# Patient Record
Sex: Male | Born: 1980 | Race: White | Hispanic: No | Marital: Married | Smoking: Current every day smoker
Health system: Southern US, Community
[De-identification: ages and names within clinical notes are randomized; demographics above are authoritative.]

## PROBLEM LIST (undated history)

## (undated) DIAGNOSIS — T4145XA Adverse effect of unspecified anesthetic, initial encounter: Secondary | ICD-10-CM

## (undated) DIAGNOSIS — G4720 Circadian rhythm sleep disorder, unspecified type: Secondary | ICD-10-CM

## (undated) DIAGNOSIS — M25579 Pain in unspecified ankle and joints of unspecified foot: Secondary | ICD-10-CM

## (undated) DIAGNOSIS — R4189 Other symptoms and signs involving cognitive functions and awareness: Secondary | ICD-10-CM

## (undated) DIAGNOSIS — K589 Irritable bowel syndrome without diarrhea: Secondary | ICD-10-CM

## (undated) DIAGNOSIS — F329 Major depressive disorder, single episode, unspecified: Secondary | ICD-10-CM

## (undated) DIAGNOSIS — F419 Anxiety disorder, unspecified: Secondary | ICD-10-CM

## (undated) DIAGNOSIS — G8929 Other chronic pain: Secondary | ICD-10-CM

## (undated) DIAGNOSIS — M25569 Pain in unspecified knee: Secondary | ICD-10-CM

## (undated) DIAGNOSIS — R200 Anesthesia of skin: Secondary | ICD-10-CM

## (undated) DIAGNOSIS — L309 Dermatitis, unspecified: Secondary | ICD-10-CM

## (undated) DIAGNOSIS — M549 Dorsalgia, unspecified: Secondary | ICD-10-CM

## (undated) DIAGNOSIS — T8859XA Other complications of anesthesia, initial encounter: Secondary | ICD-10-CM

## (undated) DIAGNOSIS — G939 Disorder of brain, unspecified: Secondary | ICD-10-CM

## (undated) DIAGNOSIS — R519 Headache, unspecified: Secondary | ICD-10-CM

## (undated) DIAGNOSIS — R51 Headache: Secondary | ICD-10-CM

## (undated) DIAGNOSIS — M1990 Unspecified osteoarthritis, unspecified site: Secondary | ICD-10-CM

## (undated) DIAGNOSIS — K529 Noninfective gastroenteritis and colitis, unspecified: Secondary | ICD-10-CM

## (undated) DIAGNOSIS — R202 Paresthesia of skin: Secondary | ICD-10-CM

## (undated) DIAGNOSIS — S069X0A Unspecified intracranial injury without loss of consciousness, initial encounter: Secondary | ICD-10-CM

## (undated) DIAGNOSIS — F32A Depression, unspecified: Secondary | ICD-10-CM

## (undated) DIAGNOSIS — G9389 Other specified disorders of brain: Secondary | ICD-10-CM

## (undated) HISTORY — PX: NASAL FRACTURE SURGERY: SHX718

## (undated) HISTORY — PX: EYE SURGERY: SHX253

## (undated) HISTORY — PX: COLONOSCOPY W/ BIOPSIES AND POLYPECTOMY: SHX1376

## (undated) HISTORY — PX: WISDOM TOOTH EXTRACTION: SHX21

## (undated) HISTORY — PX: FECAL TRANSPLANT: SHX6383

---

## 2014-02-14 ENCOUNTER — Other Ambulatory Visit: Payer: Self-pay | Admitting: Family Medicine

## 2014-02-14 DIAGNOSIS — M5412 Radiculopathy, cervical region: Secondary | ICD-10-CM

## 2014-02-19 ENCOUNTER — Ambulatory Visit
Admission: RE | Admit: 2014-02-19 | Discharge: 2014-02-19 | Disposition: A | Discharge status: Home / Self Care | Source: Ambulatory Visit | Attending: Family Medicine | Admitting: Family Medicine

## 2014-02-19 DIAGNOSIS — M5412 Radiculopathy, cervical region: Secondary | ICD-10-CM

## 2014-03-14 ENCOUNTER — Other Ambulatory Visit: Payer: Self-pay | Admitting: Neurosurgery

## 2014-03-21 ENCOUNTER — Encounter (HOSPITAL_COMMUNITY)
Admission: RE | Admit: 2014-03-21 | Discharge: 2014-03-21 | Disposition: A | Discharge status: Home / Self Care | Source: Ambulatory Visit | Attending: Neurosurgery | Admitting: Neurosurgery

## 2014-03-21 ENCOUNTER — Encounter (HOSPITAL_COMMUNITY): Payer: Self-pay

## 2014-03-21 HISTORY — DX: Anesthesia of skin: R20.0

## 2014-03-21 HISTORY — DX: Other specified disorders of brain: G93.89

## 2014-03-21 HISTORY — DX: Major depressive disorder, single episode, unspecified: F32.9

## 2014-03-21 HISTORY — DX: Paresthesia of skin: R20.2

## 2014-03-21 HISTORY — DX: Pain in unspecified knee: M25.569

## 2014-03-21 HISTORY — DX: Headache, unspecified: R51.9

## 2014-03-21 HISTORY — DX: Pain in unspecified ankle and joints of unspecified foot: M25.579

## 2014-03-21 HISTORY — DX: Other chronic pain: G89.29

## 2014-03-21 HISTORY — DX: Other complications of anesthesia, initial encounter: T88.59XA

## 2014-03-21 HISTORY — DX: Circadian rhythm sleep disorder, unspecified type: G47.20

## 2014-03-21 HISTORY — DX: Unspecified intracranial injury without loss of consciousness, initial encounter: S06.9X0A

## 2014-03-21 HISTORY — DX: Disorder of brain, unspecified: G93.9

## 2014-03-21 HISTORY — DX: Anxiety disorder, unspecified: F41.9

## 2014-03-21 HISTORY — DX: Depression, unspecified: F32.A

## 2014-03-21 HISTORY — DX: Adverse effect of unspecified anesthetic, initial encounter: T41.45XA

## 2014-03-21 HISTORY — DX: Dermatitis, unspecified: L30.9

## 2014-03-21 HISTORY — DX: Noninfective gastroenteritis and colitis, unspecified: K52.9

## 2014-03-21 HISTORY — DX: Headache: R51

## 2014-03-21 HISTORY — DX: Other symptoms and signs involving cognitive functions and awareness: R41.89

## 2014-03-21 HISTORY — DX: Unspecified osteoarthritis, unspecified site: M19.90

## 2014-03-21 HISTORY — DX: Dorsalgia, unspecified: M54.9

## 2014-03-21 HISTORY — DX: Irritable bowel syndrome without diarrhea: K58.9

## 2014-03-21 LAB — BASIC METABOLIC PANEL
ANION GAP: 9 (ref 5–15)
BUN: 12 mg/dL (ref 6–23)
CO2: 26 mmol/L (ref 19–32)
Calcium: 9.4 mg/dL (ref 8.4–10.5)
Chloride: 105 mmol/L (ref 96–112)
Creatinine, Ser: 0.91 mg/dL (ref 0.50–1.35)
GFR calc Af Amer: >90 mL/min (ref >90)
GFR calc non Af Amer: >90 mL/min (ref >90)
GLUCOSE: 91 mg/dL (ref 70–99)
POTASSIUM: 3.7 mmol/L (ref 3.5–5.1)
SODIUM: 140 mmol/L (ref 135–145)

## 2014-03-21 LAB — CBC
HCT: 45.2 % (ref 39.0–52.0)
HEMOGLOBIN: 16.2 g/dL (ref 13.0–17.0)
MCH: 32.2 pg (ref 26.0–34.0)
MCHC: 35.8 g/dL (ref 30.0–36.0)
MCV: 89.9 fL (ref 78.0–100.0)
Platelets: 189 10*3/uL (ref 150–400)
RBC: 5.03 MIL/uL (ref 4.22–5.81)
RDW: 12.8 % (ref 11.5–15.5)
WBC: 9.3 10*3/uL (ref 4.0–10.5)

## 2014-03-21 LAB — SURGICAL PCR SCREEN
MRSA, PCR: NEGATIVE
Staphylococcus aureus: NEGATIVE

## 2014-03-21 NOTE — Pre-Procedure Instructions (Signed)
Jacob Fisher  03/21/2014   Your procedure is scheduled on: Thursday, March 22, 2014  [facility] Admitting at 12:00 PM.  Call this number if you have problems the morning of surgery: [phone]   Remember:   Do not eat food or drink liquids after midnight.   Take these medicines the morning of surgery with A SIP OF WATER: escitalopram (LEXAPRO), gabapentin (NEURONTIN), if needed:acetaminophen (TYLENOL) for pain, busPIRone (BUSPAR) for anxiety, EPINEPHrine 0.3 mg/0.3 mL IJ SOAJ injection  Stop taking Aspirin, vitamins, and herbal medications (Melatonin) Do not take any NSAIDs ie: Ibuprofen, Advil, Naproxen or any medication containing Aspirin.   Do not wear jewelry, make-up or nail polish.  Do not wear lotions, powders, or perfumes. You may not wear deodorant.  Do not shave 48 hours prior to surgery. Men may shave face and neck.  Do not bring valuables to the hospital.  [facility] is not responsible for any belongings or valuables.               Contacts, dentures or bridgework may not be worn into surgery.  Leave suitcase in the car. After surgery it may be brought to your room.  For patients admitted to the hospital, discharge time is determined by your treatment team.               Patients discharged the day of surgery will not be allowed to drive home.  Name and phone number of your driver:   Special Instructions:  Special Instructions:Special Instructions: [facility] - Pre[facility]  Before surgery, you can play an important role.  Because skin is not sterile, your skin needs to be as free of germs as possible.  You can reduce the number of germs on you skin by washing with CHG (chlorahexidine gluconate) soap before surgery.  CHG is an antiseptic cleaner which kills germs and bonds with the skin to continue killing germs even after washing.  Please DO NOT use if you have an allergy to CHG or antibacterial soaps.  If your skin becomes  reddened/irritated stop using the CHG and inform your nurse when you arrive at Short Stay.  Do not shave (including legs and underarms) for at least 48 hours prior to the first CHG shower.  You may shave your face.  Please follow these instructions carefully:   1.  Shower with CHG Soap the night before surgery and the morning of Surgery.  2.  If you choose to wash your hair, wash your hair first as usual with your normal shampoo.  3.  After you shampoo, rinse your hair and body thoroughly to remove the Shampoo.  4.  Use CHG as you would any other liquid soap.  You can apply chg directly  to the skin and wash gently with scrungie or a clean washcloth.  5.  Apply the CHG Soap to your body ONLY FROM THE NECK DOWN.  Do not use on open wounds or open sores.  Avoid contact with your eyes, ears, mouth and genitals (private parts).  Wash genitals (private parts) with your normal soap.  6.  Wash thoroughly, paying special attention to the area where your surgery will be performed.  7.  Thoroughly rinse your body with warm water from the neck down.  8.  DO NOT shower/wash with your normal soap after using and rinsing off the CHG Soap.  9.  Pat yourself dry with a clean towel.  10.  Wear clean pajamas.            11.  Place clean sheets on your bed the night of your first shower and do not sleep with pets.  Day of Surgery  Do not apply any lotions/deodorants the morning of surgery.  Please wear clean clothes to the hospital/surgery center.   Please read over the following fact sheets that you were given: Pain Booklet, Coughing and Deep Breathing, MRSA Information and Surgical Site Infection Prevention

## 2014-03-22 ENCOUNTER — Inpatient Hospital Stay (HOSPITAL_COMMUNITY): Admitting: Anesthesiology

## 2014-03-22 ENCOUNTER — Inpatient Hospital Stay (HOSPITAL_COMMUNITY)
Admission: RE | Admit: 2014-03-22 | Discharge: 2014-03-23 | DRG: 473 | Disposition: A | Discharge status: Home / Self Care | Source: Ambulatory Visit | Attending: Neurosurgery | Admitting: Neurosurgery

## 2014-03-22 ENCOUNTER — Inpatient Hospital Stay (HOSPITAL_COMMUNITY)

## 2014-03-22 ENCOUNTER — Encounter (HOSPITAL_COMMUNITY)
Admission: RE | Disposition: A | Discharge status: Home / Self Care | Payer: Self-pay | Source: Ambulatory Visit | Attending: Neurosurgery

## 2014-03-22 ENCOUNTER — Encounter (HOSPITAL_COMMUNITY): Payer: Self-pay | Admitting: *Deleted

## 2014-03-22 DIAGNOSIS — F17210 Nicotine dependence, cigarettes, uncomplicated: Secondary | ICD-10-CM | POA: Diagnosis present

## 2014-03-22 DIAGNOSIS — Z01812 Encounter for preprocedural laboratory examination: Secondary | ICD-10-CM

## 2014-03-22 DIAGNOSIS — F329 Major depressive disorder, single episode, unspecified: Secondary | ICD-10-CM | POA: Diagnosis present

## 2014-03-22 DIAGNOSIS — M5412 Radiculopathy, cervical region: Secondary | ICD-10-CM | POA: Diagnosis present

## 2014-03-22 DIAGNOSIS — Z87820 Personal history of traumatic brain injury: Secondary | ICD-10-CM | POA: Diagnosis not present

## 2014-03-22 DIAGNOSIS — Z79899 Other long term (current) drug therapy: Secondary | ICD-10-CM | POA: Diagnosis not present

## 2014-03-22 DIAGNOSIS — Z419 Encounter for procedure for purposes other than remedying health state, unspecified: Secondary | ICD-10-CM

## 2014-03-22 DIAGNOSIS — M542 Cervicalgia: Secondary | ICD-10-CM | POA: Diagnosis present

## 2014-03-22 DIAGNOSIS — F419 Anxiety disorder, unspecified: Secondary | ICD-10-CM | POA: Diagnosis present

## 2014-03-22 DIAGNOSIS — M4722 Other spondylosis with radiculopathy, cervical region: Secondary | ICD-10-CM | POA: Diagnosis present

## 2014-03-22 HISTORY — PX: ANTERIOR CERVICAL DECOMP/DISCECTOMY FUSION: SHX1161

## 2014-03-22 SURGERY — ANTERIOR CERVICAL DECOMPRESSION/DISCECTOMY FUSION 1 LEVEL
Anesthesia: General | Site: Neck

## 2014-03-22 MED ORDER — ONDANSETRON HCL 4 MG/2ML IJ SOLN
INTRAMUSCULAR | Status: DC | PRN
  Administered 2014-03-22: 4 mg via INTRAVENOUS

## 2014-03-22 MED ORDER — MIDAZOLAM HCL 5 MG/5ML IJ SOLN
INTRAMUSCULAR | Status: DC | PRN
  Administered 2014-03-22: 2 mg via INTRAVENOUS

## 2014-03-22 MED ORDER — HYDROMORPHONE HCL 1 MG/ML IJ SOLN
INTRAMUSCULAR | Status: AC
Start: 2014-03-22 — End: 2014-03-23
  Filled 2014-03-22: qty 1

## 2014-03-22 MED ORDER — GLYCOPYRROLATE 0.2 MG/ML IJ SOLN
INTRAMUSCULAR | Status: DC | PRN
  Administered 2014-03-22: .7 mg via INTRAVENOUS
  Administered 2014-03-22: 0.2 mg via INTRAVENOUS

## 2014-03-22 MED ORDER — MENTHOL 3 MG MT LOZG: 1.0000 | LOZENGE | OROMUCOSAL | Status: DC | PRN

## 2014-03-22 MED ORDER — ROCURONIUM BROMIDE 100 MG/10ML IV SOLN
INTRAVENOUS | Status: DC | PRN
  Administered 2014-03-22: 30 mg via INTRAVENOUS

## 2014-03-22 MED ORDER — PANTOPRAZOLE SODIUM 40 MG IV SOLR
40.0000 mg | Freq: Every day | INTRAVENOUS | Status: DC
  Administered 2014-03-22: 40 mg via INTRAVENOUS
  Filled 2014-03-22: qty 40

## 2014-03-22 MED ORDER — OXYCODONE HCL 5 MG PO TABS
ORAL_TABLET | ORAL | Status: AC
  Administered 2014-03-22: 5 mg
  Filled 2014-03-22: qty 1

## 2014-03-22 MED ORDER — CEFAZOLIN SODIUM-DEXTROSE 2-3 GM-% IV SOLR
2.0000 g | Freq: Three times a day (TID) | INTRAVENOUS | Status: AC
  Administered 2014-03-22 – 2014-03-23 (×2): 2 g via INTRAVENOUS
  Filled 2014-03-22 (×2): qty 50

## 2014-03-22 MED ORDER — PROMETHAZINE HCL 25 MG/ML IJ SOLN: 6.2500 mg | INTRAMUSCULAR | Status: DC | PRN

## 2014-03-22 MED ORDER — HEMOSTATIC AGENTS (NO CHARGE) OPTIME
TOPICAL | Status: DC | PRN
  Administered 2014-03-22: 1 via TOPICAL

## 2014-03-22 MED ORDER — HYDROMORPHONE HCL 1 MG/ML IJ SOLN: 0.2500 mg | INTRAMUSCULAR | Status: DC | PRN

## 2014-03-22 MED ORDER — THROMBIN 5000 UNITS EX SOLR
CUTANEOUS | Status: DC | PRN
  Administered 2014-03-22 (×2): 5000 [IU] via TOPICAL

## 2014-03-22 MED ORDER — METHOCARBAMOL 500 MG PO TABS
500.0000 mg | ORAL_TABLET | Freq: Four times a day (QID) | ORAL | Status: DC | PRN
  Administered 2014-03-22 – 2014-03-23 (×3): 500 mg via ORAL
  Filled 2014-03-22 (×3): qty 1

## 2014-03-22 MED ORDER — LACTATED RINGERS IV SOLN
INTRAVENOUS | Status: DC
  Administered 2014-03-22: 11:00:00 via INTRAVENOUS

## 2014-03-22 MED ORDER — SODIUM CHLORIDE 0.9 % IJ SOLN: 3.0000 mL | INTRAMUSCULAR | Status: DC | PRN

## 2014-03-22 MED ORDER — OXYCODONE-ACETAMINOPHEN 5-325 MG PO TABS
1.0000 | ORAL_TABLET | ORAL | Status: DC | PRN
  Administered 2014-03-22 – 2014-03-23 (×2): 2 via ORAL
  Administered 2014-03-23: 1 via ORAL
  Administered 2014-03-23: 2 via ORAL
  Filled 2014-03-22 (×2): qty 2
  Filled 2014-03-22: qty 1
  Filled 2014-03-22: qty 2

## 2014-03-22 MED ORDER — PHENOL 1.4 % MT LIQD: 1.0000 | OROMUCOSAL | Status: DC | PRN

## 2014-03-22 MED ORDER — DEXAMETHASONE SODIUM PHOSPHATE 10 MG/ML IJ SOLN: 10.0000 mg | INTRAMUSCULAR | Status: DC

## 2014-03-22 MED ORDER — CEFAZOLIN SODIUM-DEXTROSE 2-3 GM-% IV SOLR
INTRAVENOUS | Status: AC
  Administered 2014-03-22: 2 g via INTRAVENOUS
  Filled 2014-03-22: qty 50

## 2014-03-22 MED ORDER — HYDROMORPHONE HCL 1 MG/ML IJ SOLN
0.2500 mg | INTRAMUSCULAR | Status: DC | PRN
  Administered 2014-03-22 (×4): 0.5 mg via INTRAVENOUS

## 2014-03-22 MED ORDER — FENTANYL CITRATE 0.05 MG/ML IJ SOLN
INTRAMUSCULAR | Status: AC
  Filled 2014-03-22: qty 5

## 2014-03-22 MED ORDER — GABAPENTIN 300 MG PO CAPS
300.0000 mg | ORAL_CAPSULE | Freq: Two times a day (BID) | ORAL | Status: DC
  Administered 2014-03-22 – 2014-03-23 (×2): 300 mg via ORAL
  Filled 2014-03-22 (×2): qty 1

## 2014-03-22 MED ORDER — CEFAZOLIN SODIUM-DEXTROSE 2-3 GM-% IV SOLR: 2.0000 g | INTRAVENOUS | Status: DC

## 2014-03-22 MED ORDER — LIDOCAINE HCL (CARDIAC) 20 MG/ML IV SOLN
INTRAVENOUS | Status: DC | PRN
  Administered 2014-03-22: 50 mg via INTRAVENOUS

## 2014-03-22 MED ORDER — HYDROMORPHONE HCL 1 MG/ML IJ SOLN
1.0000 mg | INTRAMUSCULAR | Status: DC | PRN
  Administered 2014-03-23: 1 mg via INTRAMUSCULAR
  Filled 2014-03-22: qty 1

## 2014-03-22 MED ORDER — METHOCARBAMOL 1000 MG/10ML IJ SOLN
500.0000 mg | Freq: Four times a day (QID) | INTRAVENOUS | Status: DC | PRN
  Filled 2014-03-22: qty 5

## 2014-03-22 MED ORDER — ONDANSETRON HCL 4 MG/2ML IJ SOLN: 4.0000 mg | INTRAMUSCULAR | Status: DC | PRN

## 2014-03-22 MED ORDER — BUSPIRONE HCL 10 MG PO TABS: 15.0000 mg | ORAL_TABLET | Freq: Three times a day (TID) | ORAL | Status: DC | PRN

## 2014-03-22 MED ORDER — FENTANYL CITRATE 0.05 MG/ML IJ SOLN
INTRAMUSCULAR | Status: DC | PRN
  Administered 2014-03-22 (×2): 50 ug via INTRAVENOUS
  Administered 2014-03-22: 150 ug via INTRAVENOUS
  Administered 2014-03-22 (×2): 50 ug via INTRAVENOUS

## 2014-03-22 MED ORDER — ACETAMINOPHEN 650 MG RE SUPP: 650.0000 mg | RECTAL | Status: DC | PRN

## 2014-03-22 MED ORDER — DEXAMETHASONE SODIUM PHOSPHATE 10 MG/ML IJ SOLN
INTRAMUSCULAR | Status: AC
  Administered 2014-03-22: 10 mg via INTRAVENOUS
  Filled 2014-03-22: qty 1

## 2014-03-22 MED ORDER — METHOCARBAMOL 500 MG PO TABS
ORAL_TABLET | ORAL | Status: AC
  Filled 2014-03-22: qty 1

## 2014-03-22 MED ORDER — EPHEDRINE SULFATE 50 MG/ML IJ SOLN
INTRAMUSCULAR | Status: DC | PRN
  Administered 2014-03-22: 15 mg via INTRAVENOUS

## 2014-03-22 MED ORDER — SODIUM CHLORIDE 0.9 % IV SOLN
250.0000 mL | INTRAVENOUS | Status: DC
  Administered 2014-03-23: 250 mL via INTRAVENOUS

## 2014-03-22 MED ORDER — 0.9 % SODIUM CHLORIDE (POUR BTL) OPTIME
TOPICAL | Status: DC | PRN
  Administered 2014-03-22: 1000 mL

## 2014-03-22 MED ORDER — ZOLPIDEM TARTRATE 5 MG PO TABS
10.0000 mg | ORAL_TABLET | Freq: Every evening | ORAL | Status: DC | PRN
  Administered 2014-03-23: 10 mg via ORAL
  Filled 2014-03-22: qty 2

## 2014-03-22 MED ORDER — HYDROMORPHONE HCL 1 MG/ML IJ SOLN
INTRAMUSCULAR | Status: AC
  Filled 2014-03-22: qty 1

## 2014-03-22 MED ORDER — INFLUENZA VAC SPLIT QUAD 0.5 ML IM SUSY
0.5000 mL | PREFILLED_SYRINGE | INTRAMUSCULAR | Status: DC
  Filled 2014-03-22: qty 0.5

## 2014-03-22 MED ORDER — OXYCODONE HCL 5 MG/5ML PO SOLN: 5.0000 mg | Freq: Once | ORAL | Status: DC | PRN

## 2014-03-22 MED ORDER — ACETAMINOPHEN 325 MG PO TABS: 650.0000 mg | ORAL_TABLET | ORAL | Status: DC | PRN

## 2014-03-22 MED ORDER — KCL IN DEXTROSE-NACL 20-5-0.45 MEQ/L-%-% IV SOLN: 80.0000 mL/h | INTRAVENOUS | Status: DC

## 2014-03-22 MED ORDER — ESCITALOPRAM OXALATE 10 MG PO TABS
20.0000 mg | ORAL_TABLET | Freq: Every day | ORAL | Status: DC
Start: 2014-03-23 — End: 2014-03-23
  Administered 2014-03-23: 20 mg via ORAL
  Filled 2014-03-22: qty 2

## 2014-03-22 MED ORDER — DEXAMETHASONE 4 MG PO TABS
4.0000 mg | ORAL_TABLET | Freq: Four times a day (QID) | ORAL | Status: AC
Start: 2014-03-22 — End: 2014-03-23
  Administered 2014-03-22 – 2014-03-23 (×2): 4 mg via ORAL
  Filled 2014-03-22 (×2): qty 1

## 2014-03-22 MED ORDER — MIDAZOLAM HCL 2 MG/2ML IJ SOLN
INTRAMUSCULAR | Status: AC
  Filled 2014-03-22: qty 2

## 2014-03-22 MED ORDER — PROPOFOL 10 MG/ML IV BOLUS
INTRAVENOUS | Status: DC | PRN
  Administered 2014-03-22: 200 mg via INTRAVENOUS

## 2014-03-22 MED ORDER — DEXAMETHASONE SODIUM PHOSPHATE 4 MG/ML IJ SOLN: 4.0000 mg | Freq: Four times a day (QID) | INTRAMUSCULAR | Status: AC

## 2014-03-22 MED ORDER — LACTATED RINGERS IV SOLN
INTRAVENOUS | Status: DC | PRN
  Administered 2014-03-22 (×2): via INTRAVENOUS

## 2014-03-22 MED ORDER — BACITRACIN 50000 UNITS IM SOLR
INTRAMUSCULAR | Status: DC | PRN
  Administered 2014-03-22: 15:00:00

## 2014-03-22 MED ORDER — OXYCODONE HCL 5 MG PO TABS: 5.0000 mg | ORAL_TABLET | Freq: Once | ORAL | Status: DC | PRN

## 2014-03-22 MED ORDER — SODIUM CHLORIDE 0.9 % IJ SOLN
3.0000 mL | Freq: Two times a day (BID) | INTRAMUSCULAR | Status: DC
  Administered 2014-03-22 – 2014-03-23 (×2): 3 mL via INTRAVENOUS

## 2014-03-22 MED ORDER — SUCCINYLCHOLINE CHLORIDE 20 MG/ML IJ SOLN
INTRAMUSCULAR | Status: DC | PRN
  Administered 2014-03-22: 120 mg via INTRAVENOUS

## 2014-03-22 MED ORDER — NEOSTIGMINE METHYLSULFATE 10 MG/10ML IV SOLN
INTRAVENOUS | Status: DC | PRN
  Administered 2014-03-22: 4 mg via INTRAVENOUS

## 2014-03-22 SURGICAL SUPPLY — 61 items
BAG DECANTER FOR FLEXI CONT (MISCELLANEOUS) ×3 IMPLANT
BENZOIN TINCTURE PRP APPL 2/3 (GAUZE/BANDAGES/DRESSINGS) ×3 IMPLANT
BIT DRILL TRINICA 2.3MM (BIT) ×1 IMPLANT
BRUSH SCRUB EZ PLAIN DRY (MISCELLANEOUS) ×3 IMPLANT
BUR MATCHSTICK NEURO 3.0 LAGG (BURR) ×3 IMPLANT
CANISTER SUCT 3000ML PPV (MISCELLANEOUS) ×3 IMPLANT
CLOSURE WOUND 1/2 X4 (GAUZE/BANDAGES/DRESSINGS) ×1
CONT SPEC 4OZ CLIKSEAL STRL BL (MISCELLANEOUS) ×3 IMPLANT
DRAPE C-ARM 42X72 X-RAY (DRAPES) ×6 IMPLANT
DRAPE LAPAROTOMY 100X72 PEDS (DRAPES) ×3 IMPLANT
DRAPE MICROSCOPE LEICA (MISCELLANEOUS) ×3 IMPLANT
DRAPE POUCH INSTRU U-SHP 10X18 (DRAPES) ×3 IMPLANT
DRAPE SURG 17X23 STRL (DRAPES) ×6 IMPLANT
DRILL BIT TRINICA 2.3MM (BIT) ×3
DRSG OPSITE POSTOP 4X6 (GAUZE/BANDAGES/DRESSINGS) ×3 IMPLANT
DRSG TELFA 3X8 NADH (GAUZE/BANDAGES/DRESSINGS) IMPLANT
DURAPREP 6ML APPLICATOR 50/CS (WOUND CARE) ×3 IMPLANT
ELECT COATED BLADE 2.86 ST (ELECTRODE) ×3 IMPLANT
ELECT REM PT RETURN 9FT ADLT (ELECTROSURGICAL) ×3
ELECTRODE REM PT RTRN 9FT ADLT (ELECTROSURGICAL) ×1 IMPLANT
GAUZE SPONGE 4X4 12PLY STRL (GAUZE/BANDAGES/DRESSINGS) IMPLANT
GAUZE SPONGE 4X4 16PLY XRAY LF (GAUZE/BANDAGES/DRESSINGS) IMPLANT
GLOVE BIO SURGEON STRL SZ8 (GLOVE) ×3 IMPLANT
GLOVE BIOGEL PI IND STRL 8 (GLOVE) ×2 IMPLANT
GLOVE BIOGEL PI IND STRL 8.5 (GLOVE) ×1 IMPLANT
GLOVE BIOGEL PI INDICATOR 8 (GLOVE) ×4
GLOVE BIOGEL PI INDICATOR 8.5 (GLOVE) ×2
GLOVE ECLIPSE 7.5 STRL STRAW (GLOVE) ×9 IMPLANT
GLOVE ECLIPSE 8.0 STRL XLNG CF (GLOVE) ×3 IMPLANT
GLOVE EXAM NITRILE LRG STRL (GLOVE) ×3 IMPLANT
GLOVE EXAM NITRILE XL STR (GLOVE) IMPLANT
GLOVE EXAM NITRILE XS STR PU (GLOVE) IMPLANT
GOWN STRL REUS W/ TWL LRG LVL3 (GOWN DISPOSABLE) ×1 IMPLANT
GOWN STRL REUS W/ TWL XL LVL3 (GOWN DISPOSABLE) ×1 IMPLANT
GOWN STRL REUS W/TWL 2XL LVL3 (GOWN DISPOSABLE) ×3 IMPLANT
GOWN STRL REUS W/TWL LRG LVL3 (GOWN DISPOSABLE) ×2
GOWN STRL REUS W/TWL XL LVL3 (GOWN DISPOSABLE) ×2
HALTER HD/CHIN CERV TRACTION D (MISCELLANEOUS) ×3 IMPLANT
KIT BASIN OR (CUSTOM PROCEDURE TRAY) ×3 IMPLANT
KIT ROOM TURNOVER OR (KITS) ×3 IMPLANT
NEEDLE SPNL 20GX3.5 QUINCKE YW (NEEDLE) ×3 IMPLANT
NS IRRIG 1000ML POUR BTL (IV SOLUTION) ×3 IMPLANT
PACK LAMINECTOMY NEURO (CUSTOM PROCEDURE TRAY) ×3 IMPLANT
PAD ARMBOARD 7.5X6 YLW CONV (MISCELLANEOUS) ×9 IMPLANT
PATTIES SURGICAL .25X.25 (GAUZE/BANDAGES/DRESSINGS) IMPLANT
PATTIES SURGICAL .75X.75 (GAUZE/BANDAGES/DRESSINGS) IMPLANT
PLATE 22MM (Plate) ×3 IMPLANT
PUTTY BONE GRAFT KIT 2.5ML (Bone Implant) ×3 IMPLANT
RUBBERBAND STERILE (MISCELLANEOUS) ×6 IMPLANT
SCREW SELF DRILL FIXED 14MM (Screw) ×12 IMPLANT
SPACER TMS 11X14X6MM (Spacer) ×3 IMPLANT
SPONGE INTESTINAL PEANUT (DISPOSABLE) ×3 IMPLANT
SPONGE SURGIFOAM ABS GEL SZ50 (HEMOSTASIS) ×3 IMPLANT
STRIP CLOSURE SKIN 1/2X4 (GAUZE/BANDAGES/DRESSINGS) ×2 IMPLANT
SUT PDS AB 5-0 P3 18 (SUTURE) IMPLANT
SUT VIC AB 3-0 CP2 18 (SUTURE) ×3 IMPLANT
SYR 20ML ECCENTRIC (SYRINGE) IMPLANT
TOWEL OR 17X24 6PK STRL BLUE (TOWEL DISPOSABLE) ×3 IMPLANT
TOWEL OR 17X26 10 PK STRL BLUE (TOWEL DISPOSABLE) ×3 IMPLANT
TRAP SPECIMEN MUCOUS 40CC (MISCELLANEOUS) ×3 IMPLANT
WATER STERILE IRR 1000ML POUR (IV SOLUTION) ×3 IMPLANT

## 2014-03-22 NOTE — Anesthesia Preprocedure Evaluation (Addendum)
Anesthesia Evaluation  Patient identified by MRN, date of birth, ID band Patient awake    Reviewed: Allergy & Precautions, NPO status , Patient's Chart, lab work & pertinent test results  Airway Mallampati: II  TM Distance: >3 FB Neck ROM: Limited    Dental  (+) Teeth Intact, Dental Advisory Given   Pulmonary Current Smoker,  breath sounds clear to auscultation        Cardiovascular negative cardio ROS  Rhythm:Regular Rate:Normal     Neuro/Psych  Headaches, Anxiety Depression    GI/Hepatic negative GI ROS, Neg liver ROS,   Endo/Other  negative endocrine ROS  Renal/GU negative Renal ROS     Musculoskeletal  (+) Arthritis -,   Abdominal   Peds  Hematology negative hematology ROS (+)   Anesthesia Other Findings   Reproductive/Obstetrics                            Anesthesia Physical Anesthesia Plan  ASA: II  Anesthesia Plan: General   Post-op Pain Management:    Induction: Intravenous  Airway Management Planned: Oral ETT and Video Laryngoscope Planned  Additional Equipment:   Intra-op Plan:   Post-operative Plan: Extubation in OR  Informed Consent: I have reviewed the patients History and Physical, chart, labs and discussed the procedure including the risks, benefits and alternatives for the proposed anesthesia with the patient or authorized representative who has indicated his/her understanding and acceptance.   Dental advisory given  Plan Discussed with: CRNA  Anesthesia Plan Comments:         Anesthesia Quick Evaluation

## 2014-03-22 NOTE — Progress Notes (Signed)
Dr. Gerlene FeeKritzer notified of pt's allergy to all antibiotics causing c-diff. Stated ancef was okay to give.

## 2014-03-22 NOTE — Anesthesia Procedure Notes (Signed)
Procedure Name: Intubation Date/Time: 03/22/2014 1:35 PM Performed by: Gwenyth AllegraADAMI, Hakeem Frazzini Pre-anesthesia Checklist: Patient identified, Timeout performed, Emergency Drugs available, Suction available and Patient being monitored Patient Re-evaluated:Patient Re-evaluated prior to inductionOxygen Delivery Method: Circle system utilized Preoxygenation: Pre-oxygenation with 100% oxygen Intubation Type: IV induction Ventilation: Mask ventilation without difficulty Laryngoscope Size: Glidescope and 4 Grade View: Grade I Tube type: Oral Tube size: 7.5 mm Number of attempts: 1 Airway Equipment and Method: Stylet and Video-laryngoscopy Placement Confirmation: ETT inserted through vocal cords under direct vision,  breath sounds checked- equal and bilateral and positive ETCO2 Secured at: 22 cm Tube secured with: Tape Dental Injury: Teeth and Oropharynx as per pre-operative assessment

## 2014-03-22 NOTE — Transfer of Care (Signed)
Immediate Anesthesia Transfer of Care Note  Patient: Jacob PillowJesse Fisher  Procedure(s) Performed: Procedure(s): Cervical five-six Anterior Cervical Decompression, Discectomy, Fusion (N/A)  Patient Location: PACU  Anesthesia Type:General  Level of Consciousness: awake, alert  and oriented  Airway & Oxygen Therapy: Patient Spontanous Breathing and Patient connected to nasal cannula oxygen  Post-op Assessment: Report given to RN and Post -op Vital signs reviewed and stable  Post vital signs: Reviewed and stable  Last Vitals:  Filed Vitals:   03/22/14 1600  BP:   Pulse: 76  Temp:   Resp: 13    Complications: No apparent anesthesia complications

## 2014-03-22 NOTE — Progress Notes (Signed)
Pt admitted to room 4N12 from PACU.  Pt is alert and oriented.  Pt stating he is very hungry, food order placed.  Call light within reach.  Will continue to monitor.   Estanislado Emmsshley Schwarz, RN 03/22/2014 7:41 PM

## 2014-03-22 NOTE — H&P (Signed)
Jacob Fisher is an 34 y.o. male.   Chief Complaint: Neck pain to the right shoulder and arm HPI: The patient is a 34 year old gentleman who is evaluated in the office for neck pain with radiation towards the right shoulder with numbness the distal arm and into the thumb. He's had a spinal for few months after he slipped on an air mattress for a few days. He went to a walk-in clinic where he was given prednisone shot and prednisone pack and Flexeril which did not give him much relief. He saw his medical doctors tried Neurontin Robaxin also without help. MRI scan was done he was referred to me for evaluation. When seen in the office his left arm was asymptomatic. His films were reviewed which showed a disc abnormality at C5-6 with bilateral neural foraminal encroachment particularly on the right, symptomatic side. The patient's options were discussed. He requested surgery and now comes for a C5-6 anterior cervical discectomy with fusion and plating. I've had a long discussion with him regarding the risks and benefits of surgical intervention. The risks discussed include but are not limited to leading infection weakness some as paralysis spinal fluid leak coma quadriplegia hoarseness and death. We have discussed alternative methods of therapy along with the risks and benefits of nonintervention. [name] is had the opportunity to ask numerous questions and appears to understand. With this information in hand he has requested we proceed with surgery.  Past Medical History  Diagnosis Date  . Complication of anesthesia     " blood pressure went up a bit with nasal surgery"  . IBS (irritable bowel syndrome)   . Chronic knee pain     B/L  . Arthritis   . Chronic ankle pain     B/L   . Chronic back pain   . Headache   . 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 Multiple subcortical lesions of frontal lobe   . Cognitive impairment   . Circadian rhythm disorder   . Eczema   .  Colitis   . Depression   . Anxiety   . Numbness and tingling     RUE    Past Surgical History  Procedure Laterality Date  . Nasal fracture surgery    . Wisdom tooth extraction    . Colonoscopy w/ biopsies and polypectomy    . Fecal transplant    . Eye surgery      [place]    Family History  Problem Relation Age of Onset  . Diabetes Mother   . Cancer Mother   . Hypertension Mother   . Glaucoma Mother    Social History:  reports that he has been smoking Cigarettes.  He has been smoking about 0.50 packs per day. He has quit using smokeless tobacco. His smokeless tobacco use included Chew. He reports that he does not drink alcohol or use illicit drugs.  Allergies:  Allergies  Allergen Reactions  . Bee Venom Anaphylaxis  . Other Other (See Comments)    All antibiotics= C.Diff  . Nickel Rash    Medications Prior to Admission  Medication Sig Dispense Refill  . acetaminophen (TYLENOL) 500 MG tablet Take 1,000 mg by mouth every 8 (eight) hours as needed (pain).    . busPIRone (BUSPAR) 15 MG tablet Take 15 mg by mouth 3 (three) times daily as needed (anxiety).    Marland Kitchen EPINEPHrine 0.3 mg/0.3 mL IJ SOAJ injection Inject 0.3 mg into the muscle once.    . escitalopram (  LEXAPRO) 20 MG tablet Take 20 mg by mouth daily.    . eszopiclone (LUNESTA) 2 MG TABS tablet Take 2 mg by mouth at bedtime. Take immediately before bedtime    . gabapentin (NEURONTIN) 300 MG capsule Take 300 mg by mouth 2 (two) times daily.    . Melatonin 3 MG CAPS Take 3 mg by mouth daily.    . methocarbamol (ROBAXIN) 750 MG tablet Take 750 mg by mouth every 8 (eight) hours as needed for muscle spasms.      Results for orders placed or performed during the hospital encounter of 03/21/14 (from the past 48 hour(s))  Surgical pcr screen     Status: None   Collection Time: 03/21/14  3:38 PM  Result Value Ref Range   MRSA, PCR NEGATIVE NEGATIVE   Staphylococcus aureus NEGATIVE NEGATIVE    Comment:        The Xpert SA Assay  (FDA approved for NASAL specimens in patients over 68 years of age), is one component of a comprehensive surveillance program.  Test performance has been validated by [facility] for patients greater than or equal to 61 year old. It is not intended to diagnose infection nor to guide or monitor treatment.   CBC     Status: None   Collection Time: 03/21/14  3:44 PM  Result Value Ref Range   WBC 9.3 4.0 - 10.5 K/uL   RBC 5.03 4.22 - 5.81 MIL/uL   Hemoglobin 16.2 13.0 - 17.0 g/dL   HCT 45.2 39.0 - 52.0 %   MCV 89.9 78.0 - 100.0 fL   MCH 32.2 26.0 - 34.0 pg   MCHC 35.8 30.0 - 36.0 g/dL   RDW 12.8 11.5 - 15.5 %   Platelets 189 150 - 400 K/uL  Basic metabolic panel     Status: None   Collection Time: 03/21/14  3:44 PM  Result Value Ref Range   Sodium 140 135 - 145 mmol/L   Potassium 3.7 3.5 - 5.1 mmol/L   Chloride 105 96 - 112 mmol/L   CO2 26 19 - 32 mmol/L   Glucose, Bld 91 70 - 99 mg/dL   BUN 12 6 - 23 mg/dL   Creatinine, Ser 0.91 0.50 - 1.35 mg/dL   Calcium 9.4 8.4 - 10.5 mg/dL   GFR calc non Af Amer >90 >90 mL/min   GFR calc Af Amer >90 >90 mL/min    Comment: (NOTE) The eGFR has been calculated using the CKD EPI equation. This calculation has not been validated in all clinical situations. eGFR's persistently <90 mL/min signify possible Chronic Kidney Disease.    Anion gap 9 5 - 15   No results found.  Positive for nasal congestion sinus problems problems with memory and ability to concentrate trouble with coordination  Blood pressure 143/78, pulse 98, temperature 97.9 F (36.6 C), temperature source Oral, resp. rate 20, height _0  (1.778 m), weight 80.287 kg (177 lb), SpO2 98 %.  The patient is awake alert and oriented. His no facial asymmetry. He has decreased creased reflexes on the right upper extremity versus the left. He has slight decreased deltoid and biceps strength on the right versus the left. Assessment/Plan Impression is that of a disc abnormality at  C5-6 with neural compression. The plan is for a C5-6 anterior cervical discectomy with fusion and plating.  [name], MD 03/22/2014, 12:51 PM

## 2014-03-22 NOTE — Anesthesia Postprocedure Evaluation (Signed)
  Anesthesia Post-op Note  Patient: Jacob PillowJesse Fisher  Procedure(s) Performed: Procedure(s): Cervical five-six Anterior Cervical Decompression, Discectomy, Fusion (N/A)  Patient Location: PACU  Anesthesia Type:General  Level of Consciousness: awake and alert   Airway and Oxygen Therapy: Patient Spontanous Breathing  Post-op Pain: none  Post-op Assessment: Post-op Vital signs reviewed, Patient's Cardiovascular Status Stable and Respiratory Function Stable  Post-op Vital Signs: Reviewed  Filed Vitals:   03/22/14 1800  BP:   Pulse: 89  Temp:   Resp: 8    Complications: No apparent anesthesia complications

## 2014-03-22 NOTE — Op Note (Signed)
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 Localizing fluoroscopy was used prior to incision to identify the appropriate level. Transverse incision was made in the right anterior neck started midline and headed towards the medial aspect of the sternocleidomastoid muscle. The platysma muscle was incised transversely and the natural fascial plane between the strap muscles medially and the sternal cremaster laterally was identified and followed down to the anterior aspect the cervical spine. Longus coli muscles were identified split midline and stripped away bilaterally with unipolar coagulation and Kitner dissection. Self-retaining retractor was placed for exposure Dr. should approach the appropriate level. Using a 15 blade the acid disc at C5-6 was incised. Using pituitary rongeurs and curettes approximately 90% of the disc material was removed. High-speed drill was used to widen the interspace and bony shavings were saved for use later in the case. At this time the microscope was draped brought into the field and used for the remainder of the case. Using microdissection technique the remainder of the disc material down the posterior longitudinal ligament was removed. We was then incised transversely and the cut edges removed a Kerrison punch. Thorough decompression was carried out on the spinal dura into the foramen bilaterally to remove bony overgrowth and decompressed the spinal dura and C6 nerve roots bilaterally. With this was completed we inspected once more for any evidence of residual compression and none could be identified. Irrigation was  carried out and any bleeding control proper coagulation Gelfoam. The was then irrigated with metabolic irrigation and any bleeding control proper coagulation Gelfoam. Measurements were taken and a 6 mm lordotic trabecular metal graft was chosen and filled with a mixture of autologous bone morselized allograft. After confirming hemostasis once more the plug was impacted without difficulty and fluoroscopy showed to be in good position. An appropriately length Trinica anterior cervical plate was then chosen. Under fluoroscopic guidance drill holes were placed followed by placing of 14 mm screws 4. Locking mechanism was rotated locked position and final ferocity showed good position of the plates screws and plugs. Irrigation was carried out and any bleeding control proper coagulation. The was then closed with inverted Vicryl on the platysma muscle and subcuticular layer. Steri-Strips were placed on the skin. A sterile dressing and soft collar applied and the patient was extubated and taken to recovery in stable condition.

## 2014-03-23 ENCOUNTER — Encounter (HOSPITAL_COMMUNITY): Payer: Self-pay | Admitting: Neurosurgery

## 2014-03-23 MED ORDER — OXYCODONE-ACETAMINOPHEN 5-325 MG PO TABS: 1.0000 | ORAL_TABLET | ORAL | Status: DC | PRN

## 2014-03-23 MED ORDER — PANTOPRAZOLE SODIUM 40 MG PO TBEC: 40.0000 mg | DELAYED_RELEASE_TABLET | Freq: Every day | ORAL | Status: DC

## 2014-03-23 NOTE — Progress Notes (Signed)
Pt voided 600 ml in urinal but had c/o of pressure in his bladder and still had the urge to go.  Nurse bladder scanned pt and it showed 514 ml.  In & out cath performed and got out 800ml.  Will continue to monitor.  Estanislado EmmsAshley Schwarz, RN 03/23/2014 3:53 AM

## 2014-03-23 NOTE — Discharge Summary (Signed)
  Physician Discharge Summary  Patient ID: Jacob Fisher MRN: [id number] DOB/AGE: 1980/09/02 34 y.o.  Admit date: 03/22/2014 Discharge date: 03/23/2014  Admission Diagnoses:  Discharge Diagnoses:  Active Problems:   Cervical radiculopathy   Discharged Condition: good  Hospital Course: Surgery yesterday for acdf. Did well. Ambuylated well. Minimal pain. Home pod 1, specific instructions given.  Consults: None  Significant Diagnostic Studies: none  Treatments: surgery: C 56 acdf  Discharge Exam: Blood pressure 120/59, pulse 77, temperature 97.9 F (36.6 C), temperature source Oral, resp. rate 19, height 5\' 10"  (1.778 m), weight 80.287 kg (177 lb), SpO2 99 %. Incision/Wound:clean and dry; no new neuro issues  Disposition: Final discharge disposition not confirmed     Medication List    ASK your doctor about these medications        acetaminophen 500 MG tablet  Commonly known as:  TYLENOL  Take 1,000 mg by mouth every 8 (eight) hours as needed (pain).     busPIRone 15 MG tablet  Commonly known as:  BUSPAR  Take 15 mg by mouth 3 (three) times daily as needed (anxiety).     EPINEPHrine 0.3 mg/0.3 mL Soaj injection  Commonly known as:  EPI-PEN  Inject 0.3 mg into the muscle once.     escitalopram 20 MG tablet  Commonly known as:  LEXAPRO  Take 20 mg by mouth daily.     eszopiclone 2 MG Tabs tablet  Commonly known as:  LUNESTA  Take 2 mg by mouth at bedtime. Take immediately before bedtime     gabapentin 300 MG capsule  Commonly known as:  NEURONTIN  Take 300 mg by mouth 2 (two) times daily.     Melatonin 3 MG Caps  Take 3 mg by mouth daily.     methocarbamol 750 MG tablet  Commonly known as:  ROBAXIN  Take 750 mg by mouth every 8 (eight) hours as needed for muscle spasms.         At home rest most of the time. Get up 9 or 10 times each day and take a 15 or 20 minute walk. No riding in the car and to your first postoperative appointment. If you have neck  surgery you may shower from the chest down starting on the third postoperative day. If you had back surgery he may start showering on the third postoperative day with saran wrap wrapped around your incisional area 3 times. After the shower remove the saran wrap. Take pain medicine as needed and other medications as instructed. Call my office for an appointment.  SignedReinaldo Meeker: Concetta Guion O, MD 03/23/2014, 8:40 AM

## 2015-06-05 ENCOUNTER — Other Ambulatory Visit: Payer: Self-pay | Admitting: Family Medicine

## 2015-06-05 DIAGNOSIS — R1011 Right upper quadrant pain: Secondary | ICD-10-CM

## 2015-06-05 DIAGNOSIS — R0781 Pleurodynia: Secondary | ICD-10-CM

## 2015-06-06 ENCOUNTER — Ambulatory Visit
Admission: RE | Admit: 2015-06-06 | Discharge: 2015-06-06 | Disposition: A | Discharge status: Home / Self Care | Payer: BLUE CROSS/BLUE SHIELD | Source: Ambulatory Visit | Attending: Family Medicine | Admitting: Family Medicine

## 2015-06-06 DIAGNOSIS — R0781 Pleurodynia: Secondary | ICD-10-CM

## 2015-06-06 DIAGNOSIS — R1011 Right upper quadrant pain: Secondary | ICD-10-CM

## 2018-12-17 ENCOUNTER — Other Ambulatory Visit: Payer: Self-pay

## 2018-12-17 ENCOUNTER — Encounter: Payer: Self-pay | Admitting: Emergency Medicine

## 2018-12-17 ENCOUNTER — Ambulatory Visit
Admission: EM | Admit: 2018-12-17 | Discharge: 2018-12-17 | Disposition: A | Discharge status: Home / Self Care | Payer: No Typology Code available for payment source

## 2018-12-17 DIAGNOSIS — S91302A Unspecified open wound, left foot, initial encounter: Secondary | ICD-10-CM | POA: Diagnosis not present

## 2018-12-17 DIAGNOSIS — Z23 Encounter for immunization: Secondary | ICD-10-CM

## 2018-12-17 DIAGNOSIS — R21 Rash and other nonspecific skin eruption: Secondary | ICD-10-CM | POA: Diagnosis not present

## 2018-12-17 MED ORDER — CEFTRIAXONE SODIUM 1 G IJ SOLR
1.0000 g | Freq: Once | INTRAMUSCULAR | Status: AC
  Administered 2018-12-17: 15:00:00 1 g via INTRAMUSCULAR

## 2018-12-17 MED ORDER — TETANUS-DIPHTH-ACELL PERTUSSIS 5-2.5-18.5 LF-MCG/0.5 IM SUSP
0.5000 mL | Freq: Once | INTRAMUSCULAR | Status: AC
  Administered 2018-12-17: 15:00:00 0.5 mL via INTRAMUSCULAR

## 2018-12-17 NOTE — Discharge Instructions (Signed)
Keep area clean and dry. May use hot compress on foot as needed. Return to clinic tomorrow for wound reassessment: May need additional antibiotics at that time. Your tetanus was updated today.

## 2018-12-17 NOTE — ED Triage Notes (Signed)
Pt presents to UC w/ c/o foot injury x5 days ago. Pt states 5 days ago he scratched the top of his right foot and broke the skin slightly. Pt states the next morning the top of his right foot was swollen and draining some clear/pink fluid.

## 2018-12-17 NOTE — ED Provider Notes (Signed)
[facility]    CSN: [id number] Arrival date & time: 12/17/18  1417      History   Chief Complaint Chief Complaint  Patient presents with  . skin abrasion    HPI Jacob Fisher is a 38 y.o. male presenting for right dorsal foot wound x5 days.  Patient states that he felt an intense itch, so he scratch his skin which caused a superficial abrasion.  Next morning his right foot was swollen and draining serosanguinous fluid.  Patient states he has been keeping the area clean, dry, applying Neosporin.  States that itching has resolved, though his wife advised him to get his wound checked.  Patient is unsure if something bit him, though does admit to black widow's and brown recluse spiders being on his property.  Patient denies fever, joint pain, myalgias, chest pain, shortness of breath.    Past Medical History:  Diagnosis Date  . Anxiety   . Arthritis   . Chronic ankle pain    B/L   . Chronic back pain   . Chronic knee pain    B/L  . Circadian rhythm disorder   . Cognitive impairment   . Colitis   . Complication of anesthesia    " blood pressure went up a bit with nasal surgery"  . Depression   . Eczema   . Headache   . IBS (irritable bowel syndrome)   . Multiple subcortical lesions of frontal lobe   . Numbness and tingling    RUE  . 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 Cervical radiculopathy 03/22/2014    Past Surgical History:  Procedure Laterality Date  . ANTERIOR CERVICAL DECOMP/DISCECTOMY FUSION N/A 03/22/2014   Procedure: Cervical five-six Anterior Cervical Decompression, Discectomy, Fusion;  Surgeon: [name], MD;  Location: [facility];  Service: Neurosurgery;  Laterality: N/A;  . COLONOSCOPY W/ BIOPSIES AND POLYPECTOMY    . EYE SURGERY     PRK  . FECAL TRANSPLANT    . NASAL FRACTURE SURGERY    . WISDOM TOOTH EXTRACTION         Home Medications     Prior to Admission medications   Medication Sig Start Date End Date Taking? Authorizing Provider  folic acid (FOLVITE) 1 MG tablet Take 1 mg by mouth daily.   Yes [provider]  hydrochlorothiazide (HYDRODIURIL) 25 MG tablet Take 25 mg by mouth daily.   Yes [provider]  naproxen (NAPRELAN) 500 MG 24 hr tablet Take 500 mg by mouth daily with breakfast.   Yes [provider]  OXcarbazepine (TRILEPTAL) 150 MG tablet Take 150 mg by mouth 2 (two) times daily.   Yes [provider]  pantoprazole (PROTONIX) 40 MG tablet Take 40 mg by mouth daily.   Yes [provider]  promethazine (PHENERGAN) 25 MG tablet Take 25 mg by mouth every 6 (six) hours as needed for nausea or vomiting.   Yes [provider]  QUEtiapine (SEROQUEL) 300 MG tablet Take 300 mg by mouth at bedtime.   Yes [provider]  SUMAtriptan (IMITREX) 100 MG tablet Take 100 mg by mouth every 2 (two) hours as needed for migraine. May repeat in 2 hours if headache persists or recurs.   Yes [provider]  topiramate (TOPAMAX) 25 MG tablet Take 25 mg by mouth 2 (two) times daily.   Yes [provider]  acetaminophen (  TYLENOL) 500 MG tablet Take 1,000 mg by mouth every 8 (eight) hours as needed (pain).    [provider]  busPIRone (BUSPAR) 15 MG tablet Take 15 mg by mouth 3 (three) times daily as needed (anxiety).    [provider]  EPINEPHrine 0.3 mg/0.3 mL IJ SOAJ injection Inject 0.3 mg into the muscle once.    [provider]  escitalopram (LEXAPRO) 20 MG tablet Take 10 mg by mouth daily.     [provider]  eszopiclone (LUNESTA) 2 MG TABS tablet Take 3 mg by mouth at bedtime. Take immediately before bedtime     [provider]  Melatonin 3 MG CAPS Take 3 mg by mouth daily.    [provider]  gabapentin (NEURONTIN) 300 MG capsule Take 300 mg by mouth 2 (two) times daily.  12/17/18  [provider]    Family History Family History  Problem Relation Age of Onset  . Diabetes Mother   . Cancer Mother   . Hypertension Mother   . Glaucoma Mother   . Healthy Father     Social History Social History   Tobacco Use  . Smoking status: Current Every Day Smoker    Packs/day: 1.00    Types: Cigarettes  . Smokeless tobacco: Former NeurosurgeonUser    Types: Chew  Substance Use Topics  . Alcohol use: Yes    Comment: few times a week  . Drug use: No     Allergies   Bee venom, Other, and Nickel   Review of Systems Review of Systems  Constitutional: Negative for fatigue and fever.  Respiratory: Negative for cough and shortness of breath.   Cardiovascular: Negative for chest pain and palpitations.  Musculoskeletal: Negative for back pain and myalgias.  Skin: Positive for wound. Negative for rash.  Neurological: Negative for weakness and numbness.     Physical Exam Triage Vital Signs ED Triage Vitals  Enc Vitals Group     BP      Pulse      Resp      Temp      Temp src      SpO2      Weight      Height      Head Circumference      Peak Flow      Pain Score      Pain Loc      Pain Edu?      Excl. in GC?    No data found.  Updated Vital Signs BP (!) 144/91 (BP Location: Left Arm)   Pulse 82   Temp 98.9 F (37.2 C) (Oral)   Resp 16   SpO2 96%   Visual Acuity Right Eye Distance:   Left Eye Distance:   Bilateral Distance:    Right Eye Near:   Left Eye Near:    Bilateral Near:     Physical Exam Constitutional:      General: He is not in acute distress. HENT:     Head: Normocephalic and atraumatic.  Eyes:     General: No scleral icterus.    Pupils: Pupils are equal, round, and reactive to light.  Cardiovascular:     Rate and Rhythm: Normal rate and regular rhythm.  Pulmonary:     Effort: Pulmonary effort is normal. No respiratory distress.     Breath sounds: No wheezing.  Skin:    Capillary Refill: Capillary refill takes less than 2  seconds.     Comments:  Irregular border (8 cm at largest diameter) area of superficial skin breakdown with granulation tissue.  No active discharge.  Mild TTP.  Erythema surrounding extending up to 5 mm from wound border, though is not warm.  Neurovascularly intact.  Neurological:     General: No focal deficit present.     Mental Status: He is alert.     Sensory: No sensory deficit.     Deep Tendon Reflexes: Reflexes normal.      UC Treatments / Results  Labs (all labs ordered are listed, but only abnormal results are displayed) Labs Reviewed - No data to display  EKG   Radiology No results found.  Procedures Procedures (including critical care time)  Medications Ordered in UC Medications  Tdap (BOOSTRIX) injection 0.5 mL (0.5 mLs Intramuscular Given 12/17/18 1510)  cefTRIAXone (ROCEPHIN) injection 1 g (1 g Intramuscular Given 12/17/18 1510)    Initial Impression / Assessment and Plan / UC Course  I have reviewed the triage vital signs and the nursing notes.  Pertinent labs & imaging results that were available during my care of the patient were reviewed by me and considered in my medical decision making (see chart for details).     H&P concerning for possible spider bite (brown recluse versus black widow), the patient did not observe this.  No evidence of acute necrosis at this time: lesion stable x > 3 days.  Sensation intact.  No open wound, active draining.  Tetanus updated, patient given 1 g Rocephin in office which he tolerated well.  Patient to present to clinic tomorrow for reevaluation: Would consider treating for cellulitis at that time versus continued monitoring versus wound follow-up.  Return precautions discussed, patient verbalized understanding and is agreeable to plan. Final Clinical Impressions(s) / UC Diagnoses   Final diagnoses:  Wound of left foot     Discharge Instructions     Keep area clean and dry. May use hot compress on foot as needed. Return  to clinic tomorrow for wound reassessment: May need additional antibiotics at that time. Your tetanus was updated today.    ED Prescriptions    None     I have reviewed the PDMP during this encounter.   Hall[place], [place], [place] 12/17/18 1553

## 2018-12-18 ENCOUNTER — Encounter: Payer: Self-pay | Admitting: Emergency Medicine

## 2018-12-18 ENCOUNTER — Other Ambulatory Visit: Payer: Self-pay

## 2018-12-18 ENCOUNTER — Ambulatory Visit
Admission: EM | Admit: 2018-12-18 | Discharge: 2018-12-18 | Disposition: A | Discharge status: Home / Self Care | Payer: No Typology Code available for payment source

## 2018-12-18 NOTE — ED Triage Notes (Signed)
PT here for wound check update from yesterday. Does not feel like it has improved or worsened.

## 2020-01-08 ENCOUNTER — Other Ambulatory Visit: Payer: Self-pay

## 2020-01-08 ENCOUNTER — Emergency Department (HOSPITAL_COMMUNITY)
Admission: EM | Admit: 2020-01-08 | Discharge: 2020-01-08 | Disposition: A | Discharge status: Home / Self Care | Payer: No Typology Code available for payment source | Attending: Emergency Medicine | Admitting: Emergency Medicine

## 2020-01-08 ENCOUNTER — Emergency Department (HOSPITAL_COMMUNITY): Payer: No Typology Code available for payment source

## 2020-01-08 ENCOUNTER — Encounter (HOSPITAL_COMMUNITY): Payer: Self-pay

## 2020-01-08 DIAGNOSIS — R109 Unspecified abdominal pain: Secondary | ICD-10-CM | POA: Diagnosis present

## 2020-01-08 DIAGNOSIS — Z20822 Contact with and (suspected) exposure to covid-19: Secondary | ICD-10-CM | POA: Insufficient documentation

## 2020-01-08 DIAGNOSIS — J111 Influenza due to unidentified influenza virus with other respiratory manifestations: Secondary | ICD-10-CM | POA: Insufficient documentation

## 2020-01-08 DIAGNOSIS — R197 Diarrhea, unspecified: Secondary | ICD-10-CM | POA: Diagnosis not present

## 2020-01-08 DIAGNOSIS — Z5321 Procedure and treatment not carried out due to patient leaving prior to being seen by health care provider: Secondary | ICD-10-CM | POA: Insufficient documentation

## 2020-01-08 DIAGNOSIS — R1110 Vomiting, unspecified: Secondary | ICD-10-CM | POA: Diagnosis not present

## 2020-01-08 DIAGNOSIS — R519 Headache, unspecified: Secondary | ICD-10-CM | POA: Insufficient documentation

## 2020-01-08 LAB — COMPREHENSIVE METABOLIC PANEL
ALT: 43 U/L (ref 0–44)
AST: 27 U/L (ref 15–41)
Albumin: 4.2 g/dL (ref 3.5–5.0)
Alkaline Phosphatase: 104 U/L (ref 38–126)
Anion gap: 13 (ref 5–15)
BUN: 6 mg/dL (ref 6–20)
CO2: 21 mmol/L — ABNORMAL LOW (ref 22–32)
Calcium: 9.6 mg/dL (ref 8.9–10.3)
Chloride: 102 mmol/L (ref 98–111)
Creatinine, Ser: 0.88 mg/dL (ref 0.61–1.24)
GFR, Estimated: >60 mL/min (ref >60)
Glucose, Bld: 101 mg/dL — ABNORMAL HIGH (ref 70–99)
Potassium: 3.6 mmol/L (ref 3.5–5.1)
Sodium: 136 mmol/L (ref 135–145)
Total Bilirubin: 0.7 mg/dL (ref 0.3–1.2)
Total Protein: 7.3 g/dL (ref 6.5–8.1)

## 2020-01-08 LAB — CBC
HCT: 45.3 % (ref 39.0–52.0)
Hemoglobin: 15.6 g/dL (ref 13.0–17.0)
MCH: 33.1 pg (ref 26.0–34.0)
MCHC: 34.4 g/dL (ref 30.0–36.0)
MCV: 96 fL (ref 80.0–100.0)
Platelets: 221 10*3/uL (ref 150–400)
RBC: 4.72 MIL/uL (ref 4.22–5.81)
RDW: 11.8 % (ref 11.5–15.5)
WBC: 12.6 10*3/uL — ABNORMAL HIGH (ref 4.0–10.5)
nRBC: 0 % (ref 0.0–0.2)

## 2020-01-08 LAB — RESP PANEL BY RT-PCR (FLU A&B, COVID) ARPGX2
Influenza A by PCR: NEGATIVE
Influenza B by PCR: NEGATIVE
SARS Coronavirus 2 by RT PCR: NEGATIVE

## 2020-01-08 LAB — LIPASE, BLOOD: Lipase: 26 U/L (ref 11–51)

## 2020-01-08 NOTE — ED Notes (Signed)
Pt left saying he would just go home since it doesn't look like it was going anywhere.

## 2020-01-08 NOTE — ED Triage Notes (Signed)
Pt has multiple complaints:  abd pain, headaches, emesis, bloody diarrhea, flu like symptoms all since Wednesday. Pt has not been vaccinated for COVID. resp e.u

## 2022-07-31 ENCOUNTER — Encounter: Payer: Self-pay | Admitting: Dietician

## 2022-07-31 ENCOUNTER — Encounter: Payer: No Typology Code available for payment source | Attending: Family Medicine | Admitting: Dietician

## 2022-07-31 VITALS — Ht 71.0 in | Wt 272.3 lb

## 2022-07-31 DIAGNOSIS — R635 Abnormal weight gain: Secondary | ICD-10-CM | POA: Diagnosis present

## 2022-07-31 NOTE — Progress Notes (Signed)
Medical Nutrition Therapy  Appointment Start time:  8:09  Appointment End time:  9:16  Primary concerns today: overweight  Referral diagnosis: abnormal weight gain Preferred learning style: no preference indicated (auditory, visual, hands on, no preference indicated) Learning readiness: preparation (not ready, contemplating, ready, change in progress)  NUTRITION ASSESSMENT   Anthropometrics  Start Weight at [facility]: 272.3 lbs Height:  71 in   Clinical Medical Hx: anxiety, depression, chronic back pain, foot/knee pain, migraine headachs Medications: buspirone, Allopurinol, amitriptyline HCL, atorvastatin, budesonide, bupropion HCL, buspirone, cetirizine HCL, cholecalciferol (Vit D), colchicine, divalproex, folic acid, lisinopril, magnesium oxide, melatonin, Methocarbamol, montelukast na, multivitamin, pantoprazole na, promethazine HCL, sumatriptan, thiamine    Labs: LDL 152, HCT 41.3, Chol 201, ALT 52, WBC 11.44 Notable Signs/Symptoms: none noted  Lifestyle & Dietary Hx  Pt states he would like to lose weight, stating 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 Pt states his weight gain is not due to eating, but medications. Pt states he would like to run again, stating his knees, hip and back hurts. Pt states he does not like sweets, cake, pies. Pt states at night he does not get hungry until he takes his pm medications and them eats an hour before bedtime. Pt states he only eats only one meal a day on the weekend.  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  As evidenced by Verbalizes inaccurate or incomplete information.   NUTRITION INTERVENTION  Nutrition education (E-1) on the following topics:  Fruits & Vegetables: Aim to fill half your plate with a variety of fruits and vegetables. They are rich in vitamins, minerals, and fiber, and can help reduce the risk of chronic diseases. Choose a colorful assortment of fruits and vegetables to ensure you get a wide range of nutrients. Grains and Starches: Make at least half of your grain choices whole grains, such as Da Authement rice, whole wheat bread, and oats. Whole grains provide fiber, which aids in digestion and healthy cholesterol levels. Aim for whole forms of starchy vegetables such as potatoes, sweet potatoes, beans, peas, and corn, which are fiber rich and provide many vitamins and minerals.  Protein: Incorporate lean sources of protein, such as poultry, fish, beans, nuts, and seeds, into your meals. Protein is essential for building and repairing tissues, staying full, balancing blood sugar, as well as supporting immune function. Dairy: Include low-fat or fat-free dairy products like milk, yogurt, and cheese in your diet. Dairy foods are excellent sources of calcium and vitamin D, which are crucial for bone health.  Physical Activity: Aim for 150 minutes of physical activity weekly. Regular physical activity promotes overall health-including helping to reduce risk for heart disease and diabetes,  promoting mental health, and helping [place] sleep better.  Encouraged pt to  continue to eat balanced meals inclusive of non starchy vegetables 2 times a day 7 days a week Encouraged pt to choose lean protein sources: limiting beef, pork, sausage, hotdogs, and lunch meat Encourage pt to choose healthy fats such as plant based limiting animal fats Encouraged pt to continue to drink a minium 64 fluid ounces with half being plain water to satisfy proper hydration  Encouraged patient to honor their body's internal hunger and fullness cues.  Throughout the day, check in mentally and rate hunger. Stop eating when satisfied not full regardless of how much food is left on the plate.  Get more if still hungry 20-30 minutes later.  The key is to honor satisfaction so throughout the meal, rate fullness factor and stop when comfortably satisfied not physically full. The key is to honor hunger and fullness without any feelings of guilt or shame.  Pay attention to what the internal cues are, rather than any external factors. This will enhance the confidence you have in listening to your own body and following those internal cues enabling you to increase how often you eat when you are hungry not out of appetite and stop when you are satisfied not full. Encouraged patient to avoid skipping meals; reduces excessive hunger and impulsive eating; focus on smaller serving sizes.   Handouts Provided Include  Meal Ideas handout Types of Fat Benefits of Physical Activity  Learning Style & Readiness for Change Teaching method utilized: Visual & Auditory  Demonstrated degree of understanding via: Teach Back  Barriers to learning/adherence to lifestyle change: depression  Goals Established by Pt Increase physical activity, 3 days a week, 15-20 minutes per day in the gym at work. Avoid skipping meals; eat something for breakfast; meal prepping Substitute daily soda. Eat to satisfaction vs fullness; slow down at meal and listen to our body  MONITORING & EVALUATION Dietary intake, weekly physical  activity.  Next Steps  Patient is to follow-up in 2 months.

## 2022-10-01 ENCOUNTER — Ambulatory Visit: Payer: No Typology Code available for payment source | Admitting: Dietician
